# Patient Record
Sex: Female | Born: 1990 | Race: Black or African American | Hispanic: No | Marital: Single | Smoking: Never smoker
Health system: Southern US, Community
[De-identification: ages and names within clinical notes are randomized; demographics above are authoritative.]

## PROBLEM LIST (undated history)

## (undated) DIAGNOSIS — K219 Gastro-esophageal reflux disease without esophagitis: Secondary | ICD-10-CM

## (undated) DIAGNOSIS — T7840XA Allergy, unspecified, initial encounter: Secondary | ICD-10-CM

## (undated) HISTORY — DX: Allergy, unspecified, initial encounter: T78.40XA

---

## 2010-08-24 ENCOUNTER — Ambulatory Visit: Payer: Self-pay | Admitting: Obstetrics and Gynecology

## 2010-08-31 ENCOUNTER — Ambulatory Visit: Payer: Self-pay | Admitting: Obstetrics & Gynecology

## 2012-04-17 ENCOUNTER — Emergency Department (HOSPITAL_BASED_OUTPATIENT_CLINIC_OR_DEPARTMENT_OTHER): Payer: Managed Care, Other (non HMO)

## 2012-04-17 ENCOUNTER — Encounter (HOSPITAL_BASED_OUTPATIENT_CLINIC_OR_DEPARTMENT_OTHER): Payer: Self-pay | Admitting: *Deleted

## 2012-04-17 ENCOUNTER — Emergency Department (HOSPITAL_BASED_OUTPATIENT_CLINIC_OR_DEPARTMENT_OTHER)
Admission: EM | Admit: 2012-04-17 | Discharge: 2012-04-17 | Disposition: A | Discharge status: Home / Self Care | Payer: Managed Care, Other (non HMO) | Attending: Emergency Medicine | Admitting: Emergency Medicine

## 2012-04-17 DIAGNOSIS — R0789 Other chest pain: Secondary | ICD-10-CM

## 2012-04-17 DIAGNOSIS — K219 Gastro-esophageal reflux disease without esophagitis: Secondary | ICD-10-CM | POA: Insufficient documentation

## 2012-04-17 DIAGNOSIS — R002 Palpitations: Secondary | ICD-10-CM

## 2012-04-17 DIAGNOSIS — R071 Chest pain on breathing: Secondary | ICD-10-CM | POA: Insufficient documentation

## 2012-04-17 HISTORY — DX: Gastro-esophageal reflux disease without esophagitis: K21.9

## 2012-04-17 LAB — BASIC METABOLIC PANEL
BUN: 9 mg/dL (ref 6–23)
Chloride: 103 mEq/L (ref 96–112)
GFR calc Af Amer: >90 mL/min (ref >90)
Glucose, Bld: 102 mg/dL — ABNORMAL HIGH (ref 70–99)
Potassium: 3.4 mEq/L — ABNORMAL LOW (ref 3.5–5.1)
Sodium: 137 mEq/L (ref 135–145)

## 2012-04-17 LAB — CBC
HCT: 30.6 % — ABNORMAL LOW (ref 36.0–46.0)
Hemoglobin: 10.8 g/dL — ABNORMAL LOW (ref 12.0–15.0)
MCHC: 35.3 g/dL (ref 30.0–36.0)
RBC: 3.75 MIL/uL — ABNORMAL LOW (ref 3.87–5.11)

## 2012-04-17 MED ORDER — POTASSIUM CHLORIDE CRYS ER 20 MEQ PO TBCR
40.0000 meq | EXTENDED_RELEASE_TABLET | Freq: Once | ORAL | Status: AC
  Administered 2012-04-17: 40 meq via ORAL
  Filled 2012-04-17: qty 2

## 2012-04-17 MED ORDER — POTASSIUM CHLORIDE CRYS ER 20 MEQ PO TBCR
EXTENDED_RELEASE_TABLET | ORAL | Status: AC
  Filled 2012-04-17: qty 1

## 2012-04-17 NOTE — ED Notes (Signed)
Chest tightness and irregular beat last night. Today she is having constant tightness in her chest.

## 2012-04-17 NOTE — ED Provider Notes (Signed)
History  This chart was scribed for Ethelda Chick, MD by Erskine Emery. This patient was seen in room MH06/MH06 and the patient's care was started at 16:53.   CSN: [id number]  Arrival date & time 04/17/12  1503   First MD Initiated Contact with Patient 04/17/12 1653      Chief Complaint  Patient presents with  . Chest Pain    (Consider location/radiation/quality/duration/timing/severity/associated sxs/prior treatment) HPI Victoria Mercado is a 21 y.o. female who presents to the Emergency Department complaining of constant chest tightness with associated irregular heart beat (intermittent skipping of a beat) and abnormally fast heart beat since last night. Pt denies any associated SOB, fever, cough, extremity swelling, recent travel or surgeries, or other medical issues. Pt reports currently her heart feels back to normal but the chest tightness (in upper center chest) has persisted, with nothing noted that aggravates or relieves it. Pt reports she has noticed her heart skipping before but has never seen a doctor for it. Pt has an IUD for birth control. There are no other associated systemic symptoms, there are no other alleviating or modifying factors.    EKG:    Past Medical History  Diagnosis Date  . GERD (gastroesophageal reflux disease)     History reviewed. No pertinent past surgical history.  No family history on file.  History  Substance Use Topics  . Smoking status: Never Smoker   . Smokeless tobacco: Not on file  . Alcohol Use: Yes    OB History    Grav Para Term Preterm Abortions TAB SAB Ect Mult Living                  Review of Systems A complete 10 system review of systems was obtained and all systems are negative except as noted in the HPI and PMH.    Allergies  Review of patient's allergies indicates no known allergies.  Home Medications   Current Outpatient Rx  Name Route Sig Dispense Refill  . ACETAMINOPHEN-CAFFEINE 500-65 MG PO TABS Oral Take 2  tablets by mouth once as needed. For headache    . LEVONORGESTREL 13.5 MG IU IUD Intrauterine 1 Device by Intrauterine route once. Inserted February 2013    . PRENATAL 27-0.8 MG PO TABS Oral Take 1 tablet by mouth daily.    Marland Kitchen RANITIDINE HCL 150 MG PO TABS Oral Take 150 mg by mouth daily.      Triage Vitals: BP 127/71  Pulse 88  Temp 98 F (36.7 C) (Oral)  Resp 20  SpO2 100%  Physical Exam  Nursing note and vitals reviewed. Constitutional: She is oriented to person, place, and time. She appears well-developed and well-nourished. No distress.  HENT:  Head: Normocephalic and atraumatic.  Eyes: EOM are normal. Pupils are equal, round, and reactive to light.  Neck: Neck supple. No tracheal deviation present.  Cardiovascular: Normal rate.   Pulmonary/Chest: Effort normal and breath sounds normal. No respiratory distress.       Lungs are clear. Tenderness to left costosternal junction.   Abdominal: Soft. She exhibits no distension.  Musculoskeletal: Normal range of motion. She exhibits no edema.  Neurological: She is alert and oriented to person, place, and time.  Skin: Skin is warm and dry.  Psychiatric: She has a normal mood and affect.    ED Course  Procedures (including critical care time) DIAGNOSTIC STUDIES: Oxygen Saturation is 100% on room air, normal by my interpretation.    COORDINATION OF CARE: 16:58--I evaluated the patient  and we discussed a treatment plan including a chest x-ray and check of electrolytes to which the pt agreed. I recommended that the pt use Ibuprofen for the chest pain. I instructed the pt to follow up with her PCP and potentially a cardiologist if the issues continue.    Date: 04/17/2012  Rate: 88  Rhythm: normal sinus rhythm  QRS Axis: normal  Intervals: normal  ST/T Wave abnormalities: normal  Conduction Disutrbances: none  Narrative Interpretation: unremarkable       Labs Reviewed  BASIC METABOLIC PANEL - Abnormal; Notable for the  following:    Potassium 3.4 (*)     Glucose, Bld 102 (*)     All other components within normal limits  CBC - Abnormal; Notable for the following:    RBC 3.75 (*)     Hemoglobin 10.8 (*)     HCT 30.6 (*)     All other components within normal limits   Dg Chest 2 View  04/17/2012  *RADIOLOGY REPORT*  Clinical Data: Chest tightness and pain, irregular heartbeat  CHEST - 2 VIEW  Comparison: None.  Findings: The lungs are clear.  Mediastinal contours appear normal. The heart is within upper limits of normal.  No bony abnormality is seen.  IMPRESSION: No active lung disease.  Original Report Authenticated By: Juline Patch, M.D.     1. Chest wall pain   2. Palpitations       MDM  Pt presenting with reproducible chest pain.  Given her age, no risk factors and reproducibility along with normal EKG and CXR, I have a low suspicion for ACS, PE (PERC 0) or other emergent condition at this time.  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  Recommended ibuprofen for patient 's symptoms.  Discharged with strict return precautions.  Pt agreeable with plan.    Discharged with strict return precautions.  Pt agreeable with plan.   Ethelda Chick, MD 04/18/12 [id number]

## 2012-05-17 ENCOUNTER — Ambulatory Visit (INDEPENDENT_AMBULATORY_CARE_PROVIDER_SITE_OTHER): Payer: Managed Care, Other (non HMO) | Admitting: Physician Assistant

## 2012-05-17 ENCOUNTER — Encounter: Payer: Self-pay | Admitting: Physician Assistant

## 2012-05-17 VITALS — BP 96/60 | HR 87 | Temp 98.2°F | Resp 16 | Ht 63.5 in | Wt 212.6 lb

## 2012-05-17 DIAGNOSIS — E669 Obesity, unspecified: Secondary | ICD-10-CM

## 2012-05-17 DIAGNOSIS — Z0000 Encounter for general adult medical examination without abnormal findings: Secondary | ICD-10-CM

## 2012-05-17 LAB — COMPREHENSIVE METABOLIC PANEL
ALT: 13 U/L (ref 0–35)
BUN: 9 mg/dL (ref 6–23)
CO2: 25 mEq/L (ref 19–32)
Calcium: 9.4 mg/dL (ref 8.4–10.5)
Chloride: 104 mEq/L (ref 96–112)
Creat: 0.71 mg/dL (ref 0.50–1.10)
Glucose, Bld: 96 mg/dL (ref 70–99)
Total Bilirubin: 0.3 mg/dL (ref 0.3–1.2)

## 2012-05-17 LAB — CBC
HCT: 33.2 % — ABNORMAL LOW (ref 36.0–46.0)
MCH: 28.6 pg (ref 26.0–34.0)
MCV: 81.2 fL (ref 78.0–100.0)
Platelets: 285 10*3/uL (ref 150–400)
RBC: 4.09 MIL/uL (ref 3.87–5.11)
WBC: 5.9 10*3/uL (ref 4.0–10.5)

## 2012-05-17 LAB — POCT URINALYSIS DIPSTICK
Bilirubin, UA: NEGATIVE
Blood, UA: NEGATIVE
Ketones, UA: NEGATIVE
Leukocytes, UA: NEGATIVE
Protein, UA: NEGATIVE
Spec Grav, UA: 1.02
pH, UA: 6

## 2012-05-17 LAB — LIPID PANEL
Cholesterol: 169 mg/dL (ref 0–200)
HDL: 40 mg/dL (ref >39)
Total CHOL/HDL Ratio: 4.2 Ratio
Triglycerides: 116 mg/dL (ref <150)
VLDL: 23 mg/dL (ref 0–40)

## 2012-05-17 NOTE — Progress Notes (Signed)
  Subjective:    Patient ID: Victoria Mercado, female    DOB: Feb 10, 1991, 21 y.o.   MRN: [id number]  HPI  Pt presents to clinic for CPE without Pap.  She is doing well.  Had a normal pap in 09/2011 at [facility].  Has skyla IUD and really likes it, still has a normal cycle.  Would like to discuss weight loss and dietary changes.  She eats a granola bar for breakfast, fruit for snacks and a packed lunch of rice and meat or pasta and meat.  She rarely eats a planned dinner and many nights finds herself snacking on crackers etc for dinner.  She gets no exercise.  Would like a breast exam because she feels bumps in her breast all the time and wants to see if they are normal.  Review of Systems  Constitutional: Negative for fever, chills, activity change, appetite change and fatigue.       Objective:   Physical Exam  Vitals reviewed. Constitutional: She is oriented to person, place, and time. She appears well-developed and well-nourished.  HENT:  Head: Normocephalic and atraumatic.  Right Ear: External ear normal.  Left Ear: External ear normal.  Nose: Nose normal.  Eyes: Conjunctivae and EOM are normal.  Neck: Neck supple.  Cardiovascular: Normal rate, regular rhythm and normal heart sounds.  Exam reveals no gallop and no friction rub.   No murmur heard. Pulmonary/Chest: Effort normal and breath sounds normal.  Genitourinary: No breast swelling, tenderness, discharge or bleeding.       Breast - fibrous breasts bilateral, left breast has harder area in the upper medial quadrant - 2cm, mobile, no TTP  Musculoskeletal: Normal range of motion.  Neurological: She is alert and oriented to person, place, and time. She has normal reflexes.  Skin: Skin is warm and dry.  Psychiatric: She has a normal mood and affect. Her behavior is normal. Judgment and thought content normal.       Assessment & Plan:   1. Annual physical exam  CBC, Comprehensive metabolic panel, Lipid panel, TSH, POCT urinalysis dipstick   2. Obesity  Lipid panel, TSH   1- PT to monitor the area in L breast - believe fibrous so patient to monitor and if any changes call for ultrasound 2- d/w pt at length dietary changes and exercise additions to help with weight loss.

## 2012-07-18 ENCOUNTER — Ambulatory Visit (INDEPENDENT_AMBULATORY_CARE_PROVIDER_SITE_OTHER): Payer: Managed Care, Other (non HMO) | Admitting: Family Medicine

## 2012-07-18 VITALS — BP 110/62 | HR 88 | Temp 98.4°F | Resp 16 | Ht 64.5 in | Wt 215.0 lb

## 2012-07-18 DIAGNOSIS — R51 Headache: Secondary | ICD-10-CM

## 2012-07-18 DIAGNOSIS — R519 Headache, unspecified: Secondary | ICD-10-CM

## 2012-07-18 MED ORDER — METAXALONE 800 MG PO TABS: ORAL_TABLET | ORAL | Status: DC

## 2012-07-18 NOTE — Progress Notes (Signed)
S:  This 21 y.o. AA female presents with HA since Oct 10th, located at back of head. No associated symptoms such as neck pain, nausea, visual disturbances, dizziness, weakness or syncope. No hx of head or neck trauma. Pt awakens with HA;  sleep hygiene is fair. Seasonal allergies and sinus congestion are common; no routine medication taken for this. She gets temporary relief with OTC Excedrin product. 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  ROS: As per HPI.  O:  Filed Vitals:   07/18/12 1607  BP: 110/62  Pulse: 88  Temp: 98.4 F (36.9 C)  Resp: 16   GEN: In NAD; WN,WD. HEENT: [place]/AT. EOMI w/ clear conj/ scl. EACs/TMs normal. Oroph is normal. NECK: Supple w/o LAN or TMG. COR: RRR; No edema. LUNGS: CTA; no wheezes. Normal effort. BACK: No CVAT. NEURO: A&O x 3; CNs intact. DTRs 1+/=, difficult to illicit. Motor/ sensory: normal. Gait is normal.  A/P:  1. Headache in back of head  Vitamin D, 25-hydroxy, Sedimentation Rate RX: Skelaxin 800 mg 1/2 tablet tid prn but advised bedtime dosing because it may cause dizziness. May continue OTC medication.  May be related to lack of adequate sleep. Encourage better sleep hygiene, good head support with good  pillow. Improve nutrition and activity level.

## 2012-07-18 NOTE — Patient Instructions (Addendum)
General Headache Without Cause A headache is pain or discomfort felt around the head or neck area. The specific cause of a headache may not be found. There are many causes and types of headaches. A few common ones are:  Tension headaches.  Migraine headaches.  Cluster headaches.  Chronic daily headaches. HOME CARE INSTRUCTIONS   Keep all follow-up appointments with your caregiver or any specialist referral.  Only take over-the-counter or prescription medicines for pain or discomfort as directed by your caregiver.  Lie down in a dark, quiet room when you have a headache.  Keep a headache journal to find out what may trigger your migraine headaches. For example, write down:  What you eat and drink.  How much sleep you get.  Any change to your diet or medicines.  Try massage or other relaxation techniques.  Put ice packs or heat on the head and neck. Use these 3 to 4 times per day for 15 to 20 minutes each time, or as needed.  Limit stress.  Sit up straight, and do not tense your muscles.  Quit smoking if you smoke.  Limit alcohol use.  Decrease the amount of caffeine you drink, or stop drinking caffeine.  Eat and sleep on a regular schedule.  Get 7 to 9 hours of sleep, or as recommended by your caregiver.  Keep lights dim if bright lights bother you and make your headaches worse. SEEK MEDICAL CARE IF:   You have problems with the medicines you were prescribed.  Your medicines are not working.  You have a change from the usual headache.  You have nausea or vomiting. SEEK IMMEDIATE MEDICAL CARE IF:   Your headache becomes severe.  You have a fever.  You have a stiff neck.  You have loss of vision.  You have muscular weakness or loss of muscle control.  You start losing your balance or have trouble walking.  You feel faint or pass out.  You have severe symptoms that are different from your first symptoms. MAKE SURE YOU:   Understand these  instructions.  Will watch your condition.  Will get help right away if you are not doing well or get worse. Document Released: 08/30/2005 Document Revised: 11/22/2011 Document Reviewed: 09/15/2011 [facility] Patient Information 2013 [place], [place].   Dehydration, Adult Dehydration is when you lose more fluids from the body than you take in. Vital organs like the kidneys, brain, and heart cannot function without a proper amount of fluids and salt. Any loss of fluids from the body can cause dehydration.  CAUSES   Vomiting.  Diarrhea.  Excessive sweating.  Excessive urine output.  Fever. SYMPTOMS  Mild dehydration  Thirst.  Dry lips.  Slightly dry mouth. Moderate dehydration  Very dry mouth.  Sunken eyes.  Skin does not bounce back quickly when lightly pinched and released.  Dark urine and decreased urine production.  Decreased tear production.  Headache. Severe dehydration  Very dry mouth.  Extreme thirst.  Rapid, weak pulse (more than 100 beats per minute at rest).  Cold hands and feet.  Not able to sweat in spite of heat and temperature.  Rapid breathing.  Blue lips.  Confusion and lethargy.  Difficulty being awakened.  Minimal urine production.  No tears. DIAGNOSIS  Your caregiver will diagnose dehydration based on your symptoms and your exam. Blood and urine tests will help confirm the diagnosis. The diagnostic evaluation should also identify the cause of dehydration. TREATMENT  Treatment of mild or moderate dehydration can often  be done at home by increasing the amount of fluids that you drink. It is best to drink small amounts of fluid more often. Drinking too much at one time can make vomiting worse. Refer to the home care instructions below. Severe dehydration needs to be treated at the hospital where you will probably be given intravenous (IV) fluids that contain water and electrolytes. HOME CARE INSTRUCTIONS   Ask your caregiver about  specific rehydration instructions.  Drink enough fluids to keep your urine clear or pale yellow.  Drink small amounts frequently if you have nausea and vomiting.  Eat as you normally do.  Avoid:  Foods or drinks high in sugar.  Carbonated drinks.  Juice.  Extremely hot or cold fluids.  Drinks with caffeine.  Fatty, greasy foods.  Alcohol.  Tobacco.  Overeating.  Gelatin desserts.  Wash your hands well to avoid spreading bacteria and viruses.  Only take over-the-counter or prescription medicines for pain, discomfort, or fever as directed by your caregiver.  Ask your caregiver if you should continue all prescribed and over-the-counter medicines.  Keep all follow-up appointments with your caregiver. SEEK MEDICAL CARE IF:  You have abdominal pain and it increases or stays in one area (localizes).  You have a rash, stiff neck, or severe headache.  You are irritable, sleepy, or difficult to awaken.  You are weak, dizzy, or extremely thirsty. SEEK IMMEDIATE MEDICAL CARE IF:   You are unable to keep fluids down or you get worse despite treatment.  You have frequent episodes of vomiting or diarrhea.  You have blood or green matter (bile) in your vomit.  You have blood in your stool or your stool looks black and tarry.  You have not urinated in 6 to 8 hours, or you have only urinated a small amount of very dark urine.  You have a fever.  You faint. MAKE SURE YOU:   Understand these instructions.  Will watch your condition.  Will get help right away if you are not doing well or get worse. Document Released: 08/30/2005 Document Revised: 11/22/2011 Document Reviewed: 04/19/2011 [facility] Patient Information 2013 [employer], [place].   It is important to maintain adequate hydration; try to drink 3- 4  20- ounce bottles of water daily. Other liquids are helpful as long as they do not act as a "diuretic"- substance that make you urinate more (like any beverage that  contains caffeine).

## 2012-07-19 ENCOUNTER — Encounter: Payer: Self-pay | Admitting: Family Medicine

## 2012-07-20 NOTE — Progress Notes (Signed)
Quick Note:  Please call pt and advise that the following labs are abnormal... Vitamin D level is below normal; get the over-the -counter Vitamin D3 2000 IU and take 1 capsule daily and try to get some sun exposure most days of the week.  The Sedimentation Rate was 10 points above normal which is not that significant. This value can be seen with mild to moderate inflammation in the body.  Continue the medications as we discussed. Try to make sure your nutrition is healthy and you are getting at least 7-8 hours of sleep most nights of the week.  Copy to pt. ______

## 2012-10-30 ENCOUNTER — Telehealth: Payer: Self-pay

## 2012-10-30 NOTE — Telephone Encounter (Signed)
Weber: patient calling, she had an inhaler prescription (doesn't remember what the name is) a year ago and the pharmacy says they have lost that prescription and she wants to know if you call in another prescription. She uses [facility] on [address].  Her best number is [phone]

## 2012-10-30 NOTE — Telephone Encounter (Signed)
I need to know the name of the inhaler, do not see this. She states may have been a couple years ago. She is advised to return to clinic, esp since she is needing it for shortness of breath.

## 2013-03-05 ENCOUNTER — Telehealth: Payer: Self-pay | Admitting: Physician Assistant

## 2013-03-05 NOTE — Telephone Encounter (Signed)
Pt dropped off immunization form for school several weeks ago.  Needs booster of Menveo and Tdap.  Placed futures order so pt can receive these and have form completed.

## 2013-10-21 IMAGING — CR DG CHEST 2V
2 series · 2 of 2 positions shown · non-contrast
Comparison: None.

CLINICAL DATA: Chest tightness and pain, irregular heartbeat

CHEST - 2 VIEW

[w chest pa]
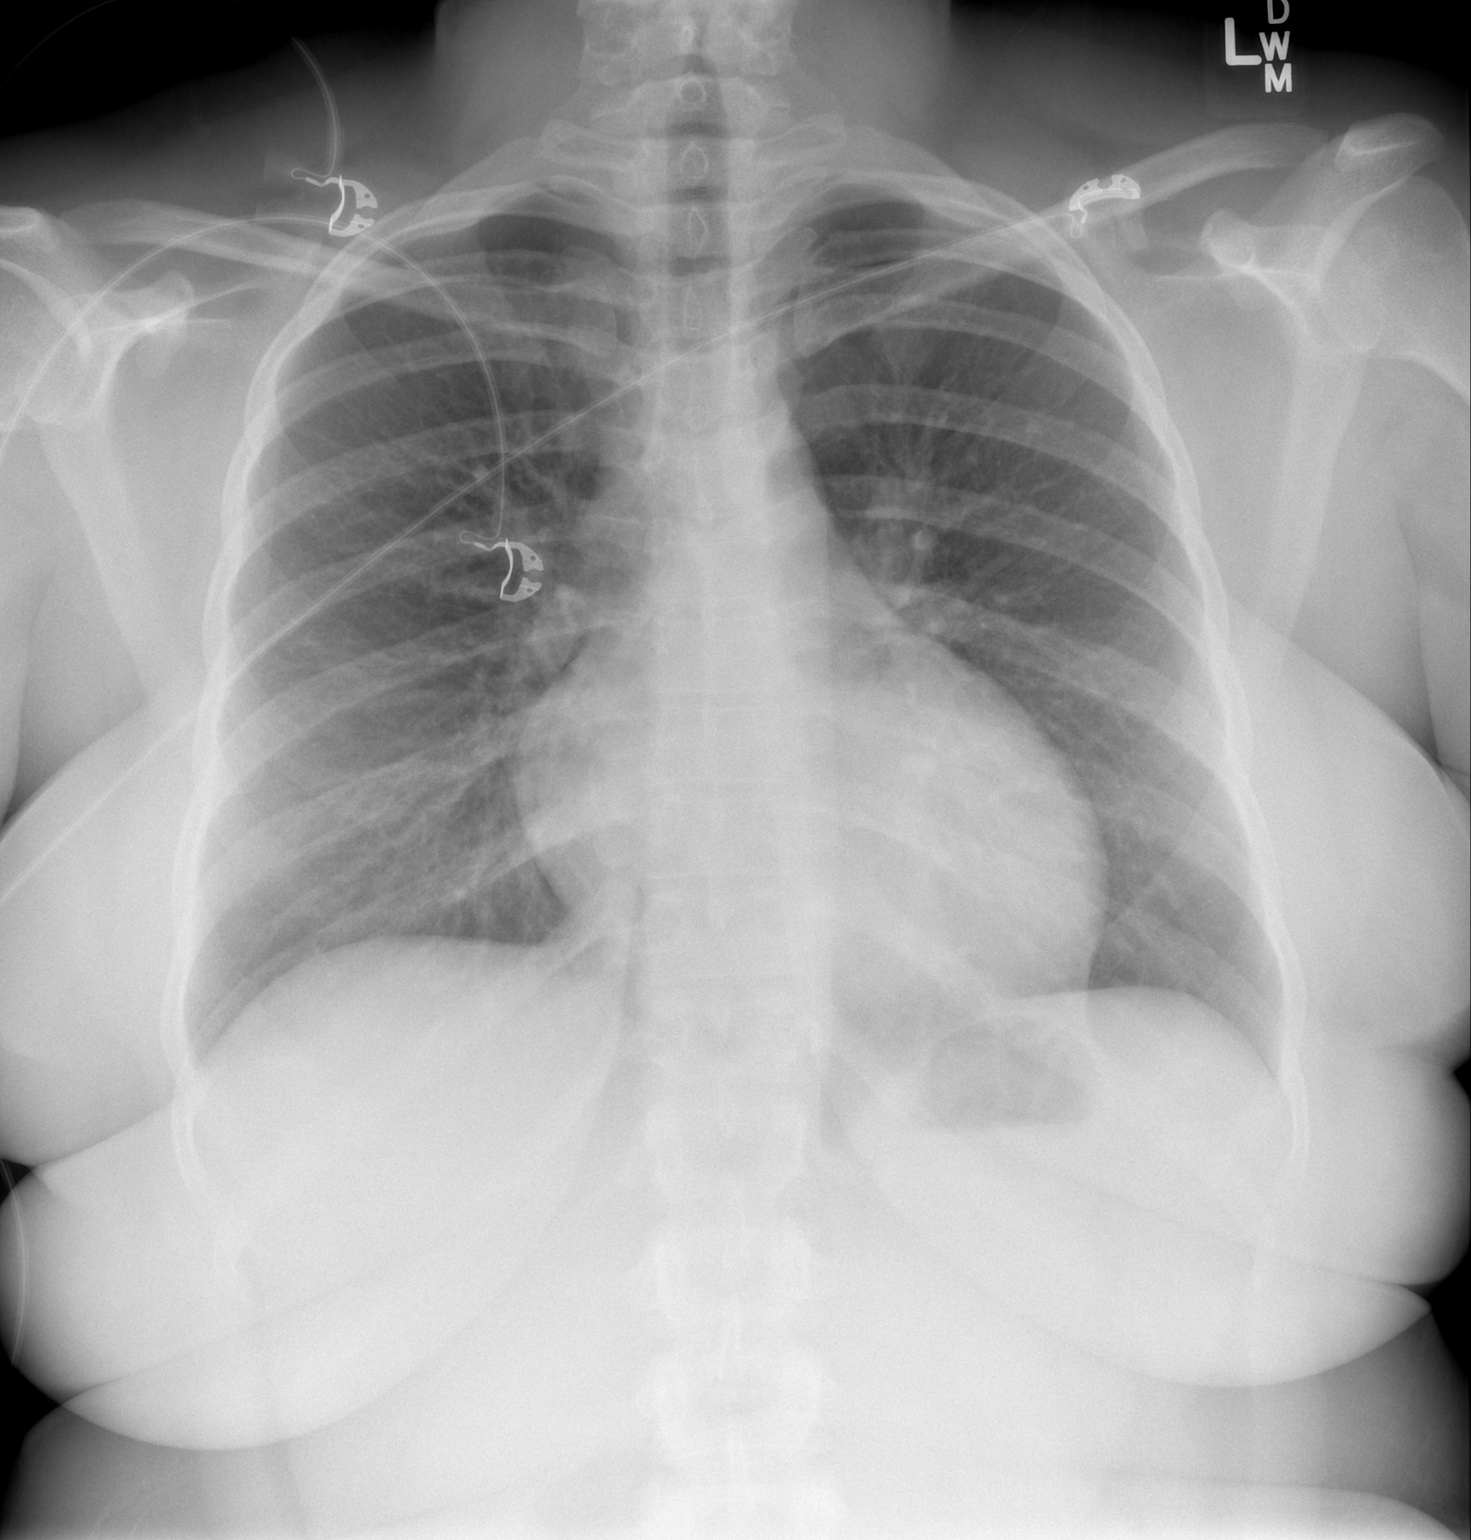

[w chest lat]
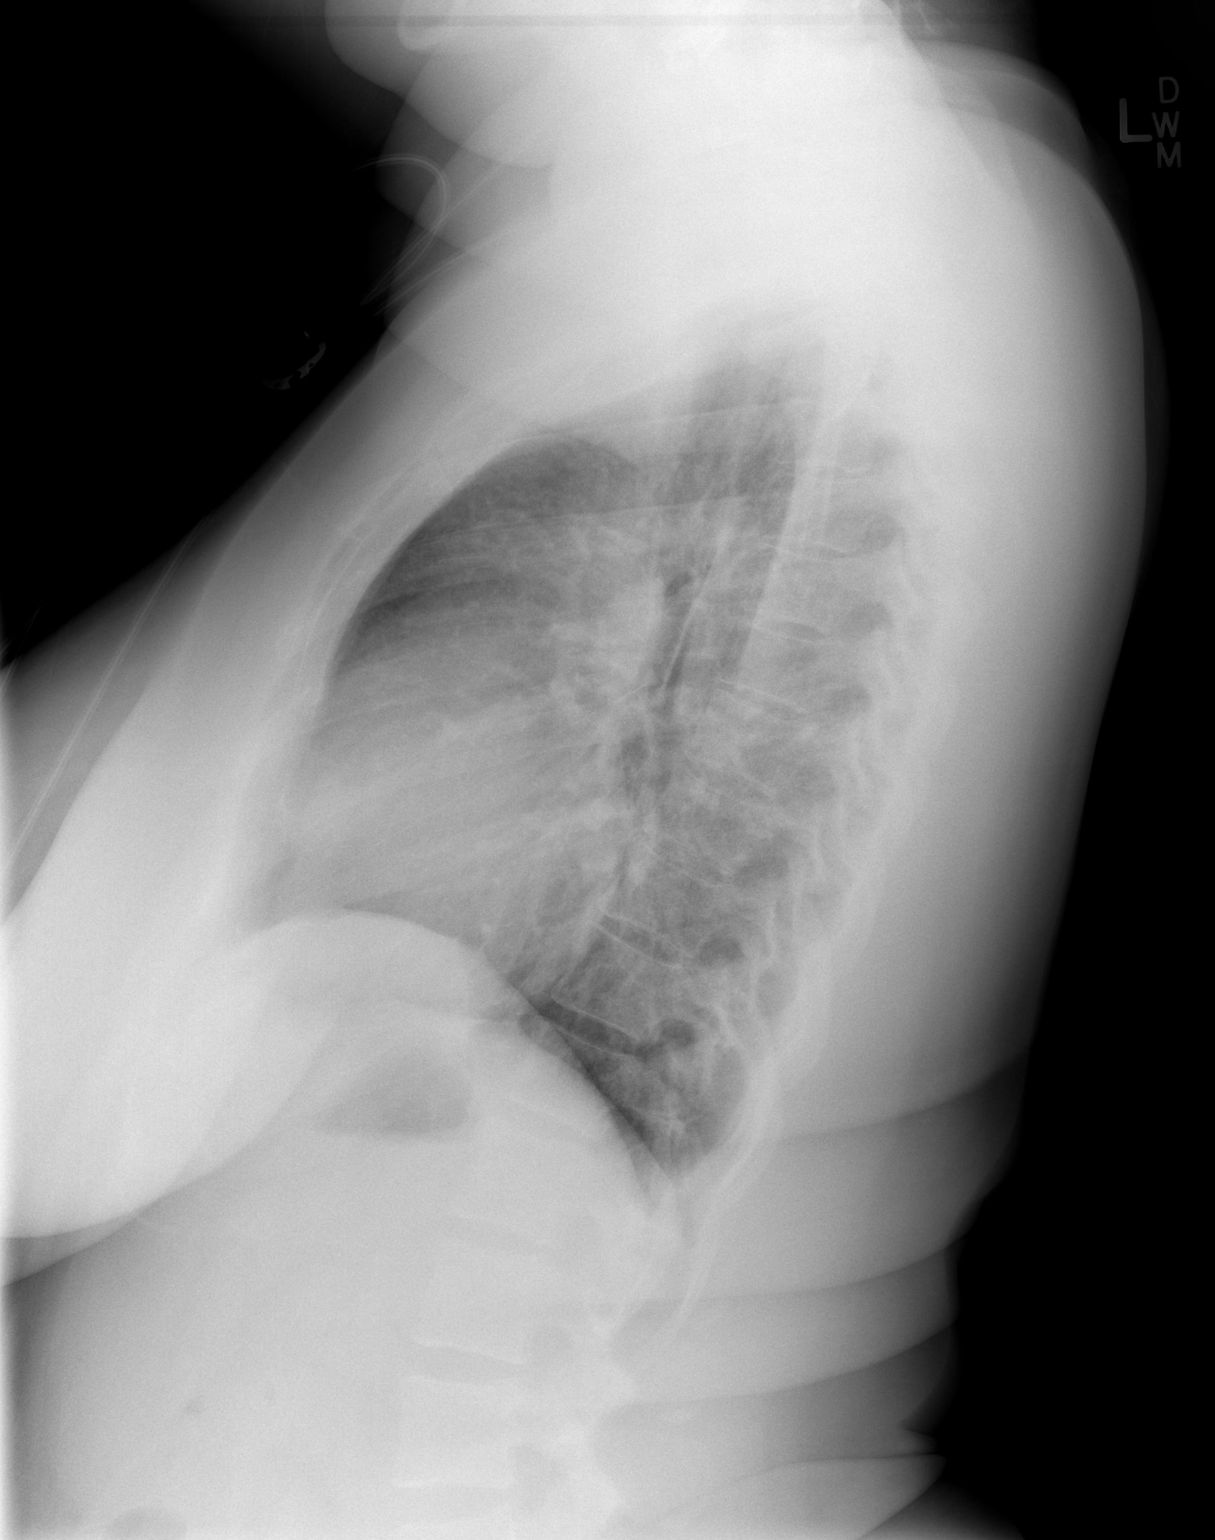

[2 of 2 positions shown; findings below may reference images not displayed]

FINDINGS: The lungs are clear.  Mediastinal contours appear normal.
The heart is within upper limits of normal.  No bony abnormality is
seen.
IMPRESSION: No active lung disease.

## 2015-10-13 ENCOUNTER — Encounter: Payer: Self-pay | Admitting: Physician Assistant

## 2015-10-13 ENCOUNTER — Ambulatory Visit (INDEPENDENT_AMBULATORY_CARE_PROVIDER_SITE_OTHER): Payer: BLUE CROSS/BLUE SHIELD | Admitting: Physician Assistant

## 2015-10-13 VITALS — BP 113/73 | HR 93 | Temp 98.2°F | Resp 16 | Ht 63.0 in | Wt 205.8 lb

## 2015-10-13 DIAGNOSIS — Z0000 Encounter for general adult medical examination without abnormal findings: Secondary | ICD-10-CM | POA: Diagnosis not present

## 2015-10-13 DIAGNOSIS — R519 Headache, unspecified: Secondary | ICD-10-CM

## 2015-10-13 DIAGNOSIS — Z130 Encounter for screening for diseases of the blood and blood-forming organs and certain disorders involving the immune mechanism: Secondary | ICD-10-CM | POA: Diagnosis not present

## 2015-10-13 DIAGNOSIS — Z13228 Encounter for screening for other metabolic disorders: Secondary | ICD-10-CM | POA: Diagnosis not present

## 2015-10-13 DIAGNOSIS — Z113 Encounter for screening for infections with a predominantly sexual mode of transmission: Secondary | ICD-10-CM | POA: Diagnosis not present

## 2015-10-13 DIAGNOSIS — R51 Headache: Secondary | ICD-10-CM

## 2015-10-13 DIAGNOSIS — Z13220 Encounter for screening for lipoid disorders: Secondary | ICD-10-CM

## 2015-10-13 MED ORDER — IBUPROFEN 800 MG PO TABS: 800.0000 mg | ORAL_TABLET | Freq: Three times a day (TID) | ORAL | Status: DC | PRN

## 2015-10-13 NOTE — Patient Instructions (Signed)

## 2015-10-13 NOTE — Progress Notes (Signed)
[facility] [address] [place] [address] [phone]  Date:  10/13/2015   Name:  Victoria Mercado   DOB:  08-13-1991   MRN:  [id number]  PCP:  Virgilio Belling    History of Present Illness:  Victoria Mercado is a 25 y.o. female patient who presents to [facility] for annual physical exam and cc of headache   HA: headaches behind her eyes which has been chronic.  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  She will have a headache going outdoors or with staring at a computer headache.  It will last all day after onset.  She will take ibuprofen which helps but the headache will last throughout the day.  This will be for  once per day.  No vision changes.  She will have a pain behind the left eye as a precursor at the left eye.  No nausea associated.  There is photophobia, however no phonophobia.  No nasal congestion or ear pain.  She has annual eye exam.  2 years she ran.  Patient has tried excedrin in the past which works.  Also attempted skelaxin which did not help.    Diet: no fried foods.  She eats fruits and vegetables.  No sodas.  No junk food.  Water intake is about 2 water bottles.    BM: Normal.  No blood in the stool.  No constipation or diarrhea.  Urination: normal.  No hematuria, dysuria, or frequency.  Sleep: wakes in the middle of the night but can get 6-7 hours per day.  Social activity: PhDcommunity heatlth and education.  She enjoys reading, hanging with friends, or watching movies.  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 [employer].  EtOH: 1-2 glasses per month Illicit drug use: None Tobacco use: none Sexually active protected.   Patient Active Problem List   Diagnosis Date Noted  . Obesity 05/17/2012    Past Medical History  Diagnosis Date  . GERD (gastroesophageal reflux disease)   . Allergy     No past surgical history on file.  Social History  Substance Use Topics  . Smoking status: Never Smoker   . Smokeless tobacco: None  . Alcohol Use: Yes     Family History  Problem Relation Age of Onset  . Cancer Maternal Grandmother 36    ovarian cancer  . Hypertension Maternal Grandfather   . Stroke Maternal Grandfather   . Hypertension Paternal Grandmother     No Known Allergies  Medication list has been reviewed and updated.  Current Outpatient Prescriptions on File Prior to Visit  Medication Sig Dispense Refill  . Acetaminophen-Caffeine (EXCEDRIN TENSION HEADACHE) 500-65 MG TABS Take 2 tablets by mouth once as needed. Reported on 10/13/2015    . Levonorgestrel (SKYLA) 13.5 MG IUD 1 Device by Intrauterine route once. Reported on 10/13/2015    . metaxalone (SKELAXIN) 800 MG tablet Take 1/2 tablet  2-3 times daily for headache. 40 tablet 1  . Prenatal Vit-Fe Fumarate-FA (MULTIVITAMIN-PRENATAL) 27-0.8 MG TABS Take 1 tablet by mouth daily. Reported on 10/13/2015    . ranitidine (ZANTAC) 150 MG tablet Take 150 mg by mouth daily. Reported on 10/13/2015     No current facility-administered medications on file prior to visit.    ROS ROS otherwise unremarkable unless listed above.   Physical Examination: BP 113/73 mmHg  Pulse 93  Temp(Src) 98.2 F (36.8 C) (Oral)  Resp 16  Ht  (1.6 m)  Wt 205 lb 12.8 oz (93.35 kg)  BMI 36.46 kg/m2  SpO2 99%  LMP 09/23/2015 Ideal Body Weight: Weight in (lb) to have BMI = 25: 140.8  Physical Exam  Constitutional: She is oriented to person, place, and time. She appears well-developed and well-nourished. No distress.  HENT:  Head: Normocephalic and atraumatic.  Right Ear: Tympanic membrane, external ear and ear canal normal.  Left Ear: Tympanic membrane, external ear and ear canal normal.  Nose: Right sinus exhibits no maxillary sinus tenderness and no frontal sinus tenderness. Left sinus exhibits no maxillary sinus tenderness and no frontal sinus tenderness.  Mouth/Throat: Oropharynx is clear and moist. No uvula swelling. No oropharyngeal exudate, posterior oropharyngeal edema or posterior  oropharyngeal erythema.  Eyes: Conjunctivae and EOM are normal. Pupils are equal, round, and reactive to light.  No abnormalities detected on fundoscopic exam.  Neck: Normal range of motion. Neck supple. No thyromegaly present.  Cardiovascular: Normal rate, regular rhythm, normal heart sounds and intact distal pulses.  Exam reveals no gallop, no distant heart sounds and no friction rub.   No murmur heard. Pulses:      Radial pulses are 2+ on the right side, and 2+ on the left side.       Dorsalis pedis pulses are 2+ on the right side, and 2+ on the left side.  Pulmonary/Chest: Effort normal and breath sounds normal. No respiratory distress. She has no decreased breath sounds. She has no wheezes. She has no rhonchi.  Abdominal: Soft. Bowel sounds are normal. She exhibits no distension and no mass. There is no tenderness.  Musculoskeletal: Normal range of motion. She exhibits no edema or tenderness.  Lymphadenopathy:       Head (right side): No submandibular, no tonsillar, no preauricular and no posterior auricular adenopathy present.       Head (left side): No submandibular, no tonsillar, no preauricular and no posterior auricular adenopathy present.    She has no cervical adenopathy.  Neurological: She is alert and oriented to person, place, and time. No cranial nerve deficit. She exhibits normal muscle tone. Coordination normal.  Skin: Skin is warm and dry. She is not diaphoretic.  Psychiatric: She has a normal mood and affect. Her behavior is normal.     Assessment and Plan: Victoria Mercado is a 25 y.o. female who is here today for annual physical exam, and to discuss concern of chronic headaches.   I will treat with ibuprofen.  At this time, I am advising a second opinion from opthalmologist.  General labs performed today.  Annual physical exam - Plan: CBC, Lipid panel, COMPLETE METABOLIC PANEL WITH GFR, HIV antibody, RPR, CANCELED: POCT urinalysis dipstick, CANCELED: POCT Microscopic  Urinalysis ([facility])  Screening for metabolic disorder - Plan: COMPLETE METABOLIC PANEL WITH GFR, CANCELED: POCT urinalysis dipstick, CANCELED: POCT Microscopic Urinalysis ([facility])  Screening for STD (sexually transmitted disease) - Plan: HIV antibody, RPR  Screening for deficiency anemia - Plan: CBC  Screening for lipid disorders - Plan: Lipid panel  Frontal headache - Plan: ibuprofen (ADVIL,MOTRIN) 800 MG tablet, Ambulatory referral to Ophthalmology  Trena Platt, PA-C [facility] 1/30/20177:25 PM

## 2015-10-15 ENCOUNTER — Telehealth: Payer: Self-pay | Admitting: *Deleted

## 2015-10-15 LAB — CBC

## 2015-10-15 LAB — COMPLETE METABOLIC PANEL WITH GFR
ALBUMIN: 4.2 g/dL (ref 3.6–5.1)
ALK PHOS: 43 U/L (ref 33–115)
ALT: 11 U/L (ref 6–29)
AST: 13 U/L (ref 10–30)
BUN: 9 mg/dL (ref 7–25)
CALCIUM: 9 mg/dL (ref 8.6–10.2)
CHLORIDE: 102 mmol/L (ref 98–110)
CO2: 27 mmol/L (ref 20–31)
CREATININE: 0.75 mg/dL (ref 0.50–1.10)
GFR, Est Non African American: >89 mL/min (ref >=60)
GLUCOSE: 81 mg/dL (ref 65–99)
Potassium: 4 mmol/L (ref 3.5–5.3)
Sodium: 138 mmol/L (ref 135–146)
TOTAL PROTEIN: 6.7 g/dL (ref 6.1–8.1)
Total Bilirubin: 0.5 mg/dL (ref 0.2–1.2)

## 2015-10-15 LAB — LIPID PANEL
CHOLESTEROL: 194 mg/dL (ref 125–200)
HDL: 52 mg/dL (ref >=46)
LDL Cholesterol: 130 mg/dL — ABNORMAL HIGH (ref <130)
TRIGLYCERIDES: 58 mg/dL (ref <150)
Total CHOL/HDL Ratio: 3.7 Ratio (ref <=5.0)
VLDL: 12 mg/dL (ref <30)

## 2015-10-15 LAB — HIV ANTIBODY (ROUTINE TESTING W REFLEX): HIV: NONREACTIVE

## 2015-10-15 LAB — RPR

## 2015-10-15 NOTE — Telephone Encounter (Signed)
That's strange.  They were all sent at the time.  We can see if she wants the test.  You can call and ask if she would like to come in.  We can do it in house if that makes it better.  Please advise that her test are negative for HIV, syphilis.  Lipid panel was unremarkable.  We can check for the anemia, or infection.  Please apologize to her.  She can come in at any time.  This is not fasting.

## 2015-10-15 NOTE — Telephone Encounter (Signed)
Lavender was not sent on pt.  Would you like for [place] to call pt back in for a redraw?

## 2015-10-22 NOTE — Telephone Encounter (Signed)
Left VM to call back 

## 2015-11-06 NOTE — Telephone Encounter (Signed)
I would like her to get a cbc in house if she wants.  This is the only lab that was missing, since the lavender was not sent.  Hx of anemia so she should get this done.

## 2015-11-06 NOTE — Telephone Encounter (Signed)
Victoria Mercado  I am going through old call messages and saw no has tried to follow up with this pt. Do you know what test it was you were trying to get done on this pt.

## 2015-11-10 ENCOUNTER — Other Ambulatory Visit: Payer: Self-pay | Admitting: Physician Assistant

## 2015-11-10 DIAGNOSIS — G43809 Other migraine, not intractable, without status migrainosus: Secondary | ICD-10-CM

## 2015-11-10 MED ORDER — SUMATRIPTAN-NAPROXEN SODIUM 85-500 MG PO TABS: 1.0000 | ORAL_TABLET | ORAL | Status: DC | PRN

## 2015-11-10 NOTE — Progress Notes (Signed)
Patient eye recommendation is anti-glare glasses.  This is possible migraine variant.  Will treat like a migraine at this time.   Advised medication use.  She will pick up today.  She states that she will return tomorrow for cbc future order, as this needs to be done.

## 2015-11-10 NOTE — Telephone Encounter (Signed)
Spoke with pt advised her to come in for lab draw. Pt agreed. Future CBC pending. She also wanted to know if you have seen the notes from her eye doctor? She states he thinks everything is fine. Did you want to attach a diagnosis to the leb: hx of anemai?

## 2016-04-26 ENCOUNTER — Other Ambulatory Visit: Payer: Self-pay | Admitting: Physician Assistant

## 2016-04-26 DIAGNOSIS — R519 Headache, unspecified: Secondary | ICD-10-CM

## 2016-04-26 DIAGNOSIS — R51 Headache: Principal | ICD-10-CM

## 2016-04-29 ENCOUNTER — Telehealth: Payer: Self-pay

## 2016-05-26 DIAGNOSIS — Z6837 Body mass index (BMI) 37.0-37.9, adult: Secondary | ICD-10-CM | POA: Diagnosis not present

## 2016-05-26 DIAGNOSIS — Z01419 Encounter for gynecological examination (general) (routine) without abnormal findings: Secondary | ICD-10-CM | POA: Diagnosis not present

## 2016-07-29 DIAGNOSIS — Z30430 Encounter for insertion of intrauterine contraceptive device: Secondary | ICD-10-CM | POA: Diagnosis not present

## 2016-07-29 DIAGNOSIS — N882 Stricture and stenosis of cervix uteri: Secondary | ICD-10-CM | POA: Diagnosis not present

## 2016-10-12 ENCOUNTER — Other Ambulatory Visit: Payer: Self-pay | Admitting: Physician Assistant

## 2016-10-12 DIAGNOSIS — R51 Headache: Principal | ICD-10-CM

## 2016-10-12 DIAGNOSIS — R519 Headache, unspecified: Secondary | ICD-10-CM

## 2016-10-12 NOTE — Telephone Encounter (Signed)
I denied request given that patient's last office visit was 09/2015. I counseled that 800mg  ibuprofen is a very high dose and has potential for adverse effects. Recommended she use APAP, Alleve or Advil otc until she can come back for an office visit.

## 2016-11-02 ENCOUNTER — Ambulatory Visit (INDEPENDENT_AMBULATORY_CARE_PROVIDER_SITE_OTHER): Payer: BLUE CROSS/BLUE SHIELD | Admitting: Physician Assistant

## 2016-11-02 ENCOUNTER — Encounter: Payer: Self-pay | Admitting: Physician Assistant

## 2016-11-02 VITALS — BP 119/80 | HR 91 | Temp 97.9°F | Ht 63.0 in | Wt 212.8 lb

## 2016-11-02 DIAGNOSIS — Z1329 Encounter for screening for other suspected endocrine disorder: Secondary | ICD-10-CM

## 2016-11-02 DIAGNOSIS — Z0000 Encounter for general adult medical examination without abnormal findings: Secondary | ICD-10-CM | POA: Diagnosis not present

## 2016-11-02 DIAGNOSIS — Z13228 Encounter for screening for other metabolic disorders: Secondary | ICD-10-CM

## 2016-11-02 DIAGNOSIS — R51 Headache: Secondary | ICD-10-CM

## 2016-11-02 DIAGNOSIS — Z130 Encounter for screening for diseases of the blood and blood-forming organs and certain disorders involving the immune mechanism: Secondary | ICD-10-CM | POA: Diagnosis not present

## 2016-11-02 DIAGNOSIS — Z13220 Encounter for screening for lipoid disorders: Secondary | ICD-10-CM

## 2016-11-02 DIAGNOSIS — R519 Headache, unspecified: Secondary | ICD-10-CM

## 2016-11-02 DIAGNOSIS — Z113 Encounter for screening for infections with a predominantly sexual mode of transmission: Secondary | ICD-10-CM | POA: Diagnosis not present

## 2016-11-02 MED ORDER — CEPHALEXIN 500 MG PO CAPS: 500.0000 mg | ORAL_CAPSULE | Freq: Two times a day (BID) | ORAL | 0 refills | Status: AC

## 2016-11-02 MED ORDER — IBUPROFEN 800 MG PO TABS: 800.0000 mg | ORAL_TABLET | Freq: Three times a day (TID) | ORAL | 0 refills | Status: DC | PRN

## 2016-11-02 NOTE — Progress Notes (Signed)
[facility] [address], [place] [address] [phone]  Date:  11/02/2016   Name:  Victoria Mercado   DOB:  1991-01-18   MRN:  [id number]  PCP:  Elizabeth Sauer    History of Present Illness:  Victoria Mercado is a 26 y.o. female patient who presents to [facility]  For annual physical exam. Diet: using my fitness pal staying under 1700.  She does not eat pork or beef.  Water intake is about 64 oz.  She has no sodas or juices or teas.    BM: NORMAL.  No blood or black stool.  No constiatpijnonb  Or diarrhea.  Urination: normal.  No dysuria,hematuria, or frequency.  Sleep is 6 hours.  Hard to stay asleep.    P[facility]ned [facility]/ [facility].  Normal findings.  Grad school, public health phd.  Going well.    Sexually active last in September.     Wt Readings from Last 3 Encounters:  11/02/16 212 lb 12.8 oz (96.5 kg)  10/13/15 205 lb 12.8 oz (93.4 kg)  07/18/12 215 lb (97.5 kg)     Patient Active Problem List   Diagnosis Date Noted  . Obesity 05/17/2012    Past Medical History:  Diagnosis Date  . Allergy   . GERD (gastroesophageal reflux disease)     History reviewed. No pertinent surgical history.  Social History  Substance Use Topics  . Smoking status: Never Smoker  . Smokeless tobacco: Never Used  . Alcohol use Yes    Family History  Problem Relation Age of Onset  . Cancer Maternal Grandmother 75    ovarian cancer  . Hypertension Maternal Grandfather   . Stroke Maternal Grandfather   . Hypertension Paternal Grandmother     No Known Allergies  Medication list has been reviewed and updated.  Current Outpatient Prescriptions on File Prior to Visit  Medication Sig Dispense Refill  . ibuprofen (ADVIL,MOTRIN) 800 MG tablet TAKE 1 TABLET(800 MG) BY MOUTH EVERY 8 HOURS AS NEEDED 30 tablet 0  . Levonorgestrel (SKYLA) 13.5 MG IUD 1 Device by Intrauterine route once. Reported on 10/13/2015    . Acetaminophen-Caffeine (EXCEDRIN  TENSION HEADACHE) 500-65 MG TABS Take 2 tablets by mouth once as needed. Reported on 10/13/2015    . metaxalone (SKELAXIN) 800 MG tablet Take 1/2 tablet  2-3 times daily for headache. (Patient not taking: Reported on 11/02/2016) 40 tablet 1  . Prenatal Vit-Fe Fumarate-FA (MULTIVITAMIN-PRENATAL) 27-0.8 MG TABS Take 1 tablet by mouth daily. Reported on 10/13/2015    . ranitidine (ZANTAC) 150 MG tablet Take 150 mg by mouth daily. Reported on 10/13/2015    . SUMAtriptan-naproxen (TREXIMET) 85-500 MG tablet Take 1 tablet by mouth every 2 (two) hours as needed for migraine. No more than 2 tablets in 24 hours. (Patient not taking: Reported on 11/02/2016) 10 tablet 0   No current facility-administered medications on file prior to visit.     Review of Systems  Constitutional: Negative for chills and fever.  HENT: Negative for ear discharge, ear pain and sore throat.   Eyes: Negative for blurred vision and double vision.  Respiratory: Negative for cough, shortness of breath and wheezing.   Cardiovascular: Negative for chest pain, palpitations and leg swelling.  Gastrointestinal: Negative for diarrhea, nausea and vomiting.  Genitourinary: Negative for dysuria, frequency and hematuria.  Skin: Negative for itching and rash.  Neurological: Negative for dizziness and headaches.     Physical Examination: BP 119/80 (BP Location:  Right Arm, Patient Position: Sitting, Cuff Size: Large)   Pulse 91   Temp 97.9 F (36.6 C) (Oral)   Ht '5\' 3"'  (1.6 m)   Wt 212 lb 12.8 oz (96.5 kg)   LMP 10/24/2016   SpO2 100%   BMI 37.70 kg/m  Ideal Body Weight: Weight in (lb) to have BMI = 25: 140.8  Physical Exam  Constitutional: She is oriented to person, place, and time. She appears well-developed and well-nourished. No distress.  HENT:  Head: Normocephalic and atraumatic.  Right Ear: Tympanic membrane, external ear and ear canal normal.  Left Ear: Tympanic membrane, external ear and ear canal normal.  Nose: Right  sinus exhibits no maxillary sinus tenderness and no frontal sinus tenderness. Left sinus exhibits no maxillary sinus tenderness and no frontal sinus tenderness.  Mouth/Throat: Oropharynx is clear and moist. No uvula swelling. No oropharyngeal exudate, posterior oropharyngeal edema or posterior oropharyngeal erythema.  Eyes: Conjunctivae and EOM are normal. Pupils are equal, round, and reactive to light.  Neck: Normal range of motion. Neck supple. No thyromegaly present.  Cardiovascular: Normal rate, regular rhythm, normal heart sounds and intact distal pulses.  Exam reveals no gallop, no distant heart sounds and no friction rub.   No murmur heard. Pulmonary/Chest: Effort normal and breath sounds normal. No respiratory distress. She has no decreased breath sounds. She has no wheezes. She has no rhonchi.  Abdominal: Soft. Bowel sounds are normal. She exhibits no distension and no mass. There is no tenderness.  Musculoskeletal: Normal range of motion. She exhibits no edema or tenderness.  Lymphadenopathy:       Head (right side): No submandibular, no tonsillar, no preauricular and no posterior auricular adenopathy present.       Head (left side): No submandibular, no tonsillar, no preauricular and no posterior auricular adenopathy present.    She has no cervical adenopathy.  Neurological: She is alert and oriented to person, place, and time. No cranial nerve deficit. She exhibits normal muscle tone. Coordination normal.  Skin: Skin is warm and dry. She is not diaphoretic.  Psychiatric: She has a normal mood and affect. Her behavior is normal.     Assessment and Plan: Victoria Mercado is a 26 y.o. female who is here today for annual physical exam. Very rarely takes ibuprofen for head pain, and I will refill it at this time. General physical labs performed today.   Annual physical exam - Plan: CMP14+EGFR, CBC, TSH, RPR, HIV antibody, Lipid panel  Screening for deficiency anemia - Plan:  CBC  Screening for thyroid disorder - Plan: TSH  Screening for STD (sexually transmitted disease) - Plan: RPR, HIV antibody  Screening for metabolic disorder - Plan: CMP14+EGFR  Screening for lipid disorders - Plan: Lipid panel  Frontal headache - Plan: ibuprofen (ADVIL,MOTRIN) 800 MG tablet  Ivar Drape, PA-C [facility] 2/28/201810:49 PM

## 2016-11-02 NOTE — Patient Instructions (Addendum)
Continue to use warm compresses in the vaginal area.  Heating a sock full of rice and hold the heat as well.  Take antibiotic as prescribed.  Let me know if there is no improvement within the next 3 days.     IF you received an x-ray today, you will receive an invoice from [facility]. Please contact [facility] at [phone] with questions or concerns regarding your invoice.   IF you received labwork today, you will receive an invoice from [place]. Please contact [facility] at [phone] with questions or concerns regarding your invoice.   Our billing staff will not be able to assist you with questions regarding bills from these companies.  You will be contacted with the lab results as soon as they are available. The fastest way to get your results is to activate your My Chart account. Instructions are located on the last page of this paperwork. If you have not heard from [place] regarding the results in 2 weeks, please contact this office.     Keeping You Healthy  Get These Tests 1. Blood Pressure- Have your blood pressure checked once a year by your health care provider.  Normal blood pressure is 120/80. 2. Weight- Have your body mass index (BMI) calculated to screen for obesity.  BMI is measure of body fat based on height and weight.  You can also calculate your own BMI at https://www.west-esparza.com/. 3. Cholesterol- Have your cholesterol checked every 5 years starting at age 104 then yearly starting at age 44. 4. Chlamydia, HIV, and other sexually transmitted diseases- Get screened every year until age 30, then within three months of each new sexual provider. 5. Pap Test - Every 1-5 years; discuss with your health care provider. 6. Mammogram- Every 1-2 years starting at age 12--50  Take these medicines  Calcium with Vitamin D-Your body needs 1200 mg of Calcium each day and [phone] IU of Vitamin D daily.  Your body can only absorb 500 mg of Calcium at a time so Calcium  must be taken in 2 or 3 divided doses throughout the day.  Multivitamin with folic acid- Once daily if it is possible for you to become pregnant.  Get these Immunizations  Gardasil-Series of three doses; prevents HPV related illness such as genital warts and cervical cancer.  Menactra-Single dose; prevents meningitis.  Tetanus shot- Every 10 years.  Flu shot-Every year.  Take these steps 1. Do not smoke-Your healthcare provider can help you quit.  For tips on how to quit go to www.smokefree.gov or call 1-800 QUITNOW. 2. Be physically active- Exercise 5 days a week for at least 30 minutes.  If you are not already physically active, start slow and gradually work up to 30 minutes of moderate physical activity.  Examples of moderate activity include walking briskly, dancing, swimming, bicycling, etc. 3. Breast Cancer- A self breast exam every month is important for early detection of breast cancer.  For more information and instruction on self breast exams, ask your healthcare provider or [place].es. 4. Eat a healthy diet- Eat a variety of healthy foods such as fruits, vegetables, whole grains, low fat milk, low fat cheeses, yogurt, lean meats, poultry and fish, beans, nuts, tofu, etc.  For more information go to www. Thenutritionsource.org 5. Drink alcohol in moderation- Limit alcohol intake to one drink or less per day. Never drink and drive. 6. Depression- Your emotional health is as important as your physical health.  If you're feeling down or losing interest in things you  normally enjoy please talk to your healthcare provider about being screened for depression. 7. Dental visit- Brush and floss your teeth twice daily; visit your dentist twice a year. 8. Eye doctor- Get an eye exam at least every 2 years. 9. Helmet use- Always wear a helmet when riding a bicycle, motorcycle, rollerblading or skateboarding. 10. Safe sex- If you may be exposed to sexually  transmitted infections, use a condom. 11. Seat belts- Seat belts can save your live; always wear one. 12. Smoke/Carbon Monoxide detectors- These detectors need to be installed on the appropriate level of your home. Replace batteries at least once a year. 13. Skin cancer- When out in the sun please cover up and use sunscreen 15 SPF or higher. 14. Violence- If anyone is threatening or hurting you, please tell your healthcare provider.

## 2016-11-03 LAB — CMP14+EGFR
ALBUMIN: 4.1 g/dL (ref 3.5–5.5)
ALK PHOS: 50 IU/L (ref 39–117)
ALT: 13 IU/L (ref 0–32)
AST: 14 IU/L (ref 0–40)
Albumin/Globulin Ratio: 1.6 (ref 1.2–2.2)
BUN/Creatinine Ratio: 11 (ref 9–23)
BUN: 8 mg/dL (ref 6–20)
Bilirubin Total: 0.3 mg/dL (ref 0.0–1.2)
CALCIUM: 8.8 mg/dL (ref 8.7–10.2)
CO2: 22 mmol/L (ref 18–29)
CREATININE: 0.73 mg/dL (ref 0.57–1.00)
Chloride: 102 mmol/L (ref 96–106)
GFR, EST AFRICAN AMERICAN: 131 (ref >59)
GFR, EST NON AFRICAN AMERICAN: 113 (ref >59)
GLUCOSE: 83 mg/dL (ref 65–99)
Globulin, Total: 2.6 (ref 1.5–4.5)
Potassium: 4 mmol/L (ref 3.5–5.2)
SODIUM: 140 mmol/L (ref 134–144)
TOTAL PROTEIN: 6.7 g/dL (ref 6.0–8.5)

## 2016-11-03 LAB — CBC
HEMATOCRIT: 34.9 % (ref 34.0–46.6)
Hemoglobin: 11.5 g/dL (ref 11.1–15.9)
MCH: 29.2 pg (ref 26.6–33.0)
MCHC: 33 g/dL (ref 31.5–35.7)
MCV: 89 fL (ref 79–97)
Platelets: 234 10*3/uL (ref 150–379)
RBC: 3.94 x10E6/uL (ref 3.77–5.28)
RDW: 13.8 % (ref 12.3–15.4)
WBC: 3.8 10*3/uL (ref 3.4–10.8)

## 2016-11-03 LAB — RPR: RPR Ser Ql: NONREACTIVE

## 2016-11-03 LAB — HIV ANTIBODY (ROUTINE TESTING W REFLEX): HIV SCREEN 4TH GENERATION: NONREACTIVE

## 2016-11-03 LAB — LIPID PANEL
Chol/HDL Ratio: 3.5 (ref 0.0–4.4)
Cholesterol, Total: 181 mg/dL (ref 100–199)
HDL: 51 mg/dL (ref >39)
LDL CALC: 115 — AB (ref 0–99)
Triglycerides: 76 mg/dL (ref 0–149)
VLDL CHOLESTEROL CAL: 15 (ref 5–40)

## 2016-11-03 LAB — TSH: TSH: 1.28 u[IU]/mL (ref 0.450–4.500)

## 2017-02-17 ENCOUNTER — Other Ambulatory Visit: Payer: Self-pay | Admitting: Physician Assistant

## 2017-02-17 DIAGNOSIS — R51 Headache: Principal | ICD-10-CM

## 2017-02-17 DIAGNOSIS — R519 Headache, unspecified: Secondary | ICD-10-CM

## 2017-02-17 MED ORDER — IBUPROFEN 800 MG PO TABS: 800.0000 mg | ORAL_TABLET | Freq: Three times a day (TID) | ORAL | 0 refills | Status: AC | PRN

## 2017-02-17 NOTE — Telephone Encounter (Signed)
PATIENT WOULD LIKE Victoria Mercado TO KNOW THAT SHE NEEDS A REFILL ON HER IBUPROFEN 800 MG. SHE WOULD LIKE TO GET MORE THAN 30 PILLS IF POSSIBLE. BEST PHONE [phone] (CELL) PHARMACY CHOICE IS [facility] ON [address]. MBC

## 2017-02-17 NOTE — Telephone Encounter (Signed)
11/02/16 last ov and refill

## 2017-02-17 NOTE — Telephone Encounter (Signed)
Please advise patient: She really should not take every day.  It can cause cardiovascular risk and GI problems.  If she is needing this more than every day, then we should look at other options.  Refilling for 90

## 2017-07-28 ENCOUNTER — Other Ambulatory Visit: Payer: Self-pay

## 2017-07-28 ENCOUNTER — Ambulatory Visit: Payer: BLUE CROSS/BLUE SHIELD | Admitting: Physician Assistant

## 2017-07-28 ENCOUNTER — Ambulatory Visit: Payer: Self-pay | Admitting: Physician Assistant

## 2017-07-28 VITALS — BP 118/76 | HR 96 | Temp 98.2°F | Resp 16 | Ht 63.0 in | Wt 217.0 lb

## 2017-07-28 DIAGNOSIS — M533 Sacrococcygeal disorders, not elsewhere classified: Secondary | ICD-10-CM

## 2017-07-28 MED ORDER — CYCLOBENZAPRINE HCL 10 MG PO TABS: 10.0000 mg | ORAL_TABLET | Freq: Three times a day (TID) | ORAL | 0 refills | Status: AC | PRN

## 2017-07-28 MED ORDER — MELOXICAM 15 MG PO TABS: 15.0000 mg | ORAL_TABLET | Freq: Every day | ORAL | 0 refills | Status: AC

## 2017-07-28 NOTE — Patient Instructions (Addendum)
Please note that the flexeril can cause some sedation.  Do not take the muscle relaxant above, while driving or operating heavy machinery. The mobic is like an nsaid--so do not take the ibuprofen, or naproxen, while taking this medication.  This will help with the inflammation Please ice the area three times per day for 15 minutes. Please use a donut, or padding under the buttock when sitting. And please stand every 30 minutes and walk around for 5 minutes, or get the pressure off the area.  If you have been doing this, then shorten the interval of sitting to standing.       IF you received an x-ray today, you will receive an invoice from [facility]. Please contact [facility] at [phone] with questions or concerns regarding your invoice.   IF you received labwork today, you will receive an invoice from [facility]. Please contact [facility] at [phone] with questions or concerns regarding your invoice.   Our billing staff will not be able to assist you with questions regarding bills from these companies.  You will be contacted with the lab results as soon as they are available. The fastest way to get your results is to activate your My Chart account. Instructions are located on the last page of this paperwork. If you have not heard from us regarding the results in 2 weeks, please contact this office.

## 2017-07-28 NOTE — Progress Notes (Signed)
[facility] [address], [place] [address] [phone]  Date:  07/28/2017   Name:  Victoria Mercado Bonnes   DOB:  07/25/1991   MRN:  [id number]  PCP:  Garnetta BuddyEnglish, Stephanie D, PA    History of Present Illness:  Victoria Mercado Chimenti is a 26 y.o. female patient who presents to PCP with  Chief Complaint  Patient presents with  . Back Pain    x 2 wks     Tail bone pain for the last 2 weeks.  The pain has worsened.  She has not noted any swelling, but feels like her muscles are tight there.  At times the pain will wake her up at night.  She has not had any heavy lifting.  No dysuria, hematuria, or frequency.  No fever.   She tried a heating pad.   She is sitting and typing 15 hours per day.    Patient Active Problem List   Diagnosis Date Noted  . Obesity 05/17/2012    Past Medical History:  Diagnosis Date  . Allergy   . GERD (gastroesophageal reflux disease)     No past surgical history on file.  Social History   Tobacco Use  . Smoking status: Never Smoker  . Smokeless tobacco: Never Used  Substance Use Topics  . Alcohol use: Yes  . Drug use: No    Family History  Problem Relation Age of Onset  . Cancer Maternal Grandmother [id number]       ovarian cancer  . Hypertension Maternal Grandfather   . Stroke Maternal Grandfather   . Hypertension Paternal Grandmother     No Known Allergies  Medication list has been reviewed and updated.  Current Outpatient Medications on File Prior to Visit  Medication Sig Dispense Refill  . ibuprofen (ADVIL,MOTRIN) 800 MG tablet Take 1 tablet (800 mg total) by mouth every 8 (eight) hours as needed. 90 tablet 0  . Levonorgestrel (SKYLA) 13.5 MG IUD 1 Device by Intrauterine route once. Reported on 10/13/2015    . Acetaminophen-Caffeine (EXCEDRIN TENSION HEADACHE) 500-65 MG TABS Take 2 tablets by mouth once as needed. Reported on 10/13/2015    . metaxalone (SKELAXIN) 800 MG tablet Take 1/2 tablet  2-3 times daily for headache. (Patient not  taking: Reported on 11/02/2016) 40 tablet 1  . Prenatal Vit-Fe Fumarate-FA (MULTIVITAMIN-PRENATAL) 27-0.8 MG TABS Take 1 tablet by mouth daily. Reported on 10/13/2015    . ranitidine (ZANTAC) 150 MG tablet Take 150 mg by mouth daily. Reported on 10/13/2015    . SUMAtriptan-naproxen (TREXIMET) 85-500 MG tablet Take 1 tablet by mouth every 2 (two) hours as needed for migraine. No more than 2 tablets in 24 hours. (Patient not taking: Reported on 11/02/2016) 10 tablet 0   No current facility-administered medications on file prior to visit.     ROS ROS otherwise unremarkable unless listed above.  Physical Examination: BP 118/76   Pulse 96   Temp 98.2 F (36.8 C) (Oral)   Resp 16   Ht 5\' 3"  (1.6 m)   Wt 217 lb (98.4 kg)   LMP 07/15/2017   SpO2 97%   BMI 38.44 kg/m  Ideal Body Weight: Weight in (lb) to have BMI = 25: 140.8  Physical Exam  Constitutional: She is oriented to person, place, and time. She appears well-developed and well-nourished. No distress.  HENT:  Head: Normocephalic and atraumatic.  Right Ear: External ear normal.  Left Ear: External ear normal.  Eyes: Conjunctivae and EOM are normal. Pupils are  equal, round, and reactive to light.  Cardiovascular: Normal rate.  Pulmonary/Chest: Effort normal. No respiratory distress.  Musculoskeletal:       Lumbar back: She exhibits bony tenderness (along the proximal sacrum and neighboring musculature <right). She exhibits normal range of motion.  Normal rom  Neurological: She is alert and oriented to person, place, and time.  Skin: Skin is warm, dry and intact. She is not diaphoretic.  Gluteal cleft and neighboring skin without erythema or inflammatory changes.   Psychiatric: She has a normal mood and affect. Her behavior is normal.     Assessment and Plan: Victoria Mercado [place] is a 26 y.o. female who is here today for cc of  Chief Complaint  Patient presents with  . Back Pain    x 2 wks   This appears to be due to prolonged  sitting.  Could be musculature more likely with strain/pressure inflammation.  Differential includes sacroiliitis, vs pilonidal disease. Advised icing, taking pressure off with periodic standing, and additional cushioning.  Anti-inflammatory given with precautions advised.   If pain worsens, will perform imaging.  Sacral back pain - Plan: cyclobenzaprine (FLEXERIL) 10 MG tablet, meloxicam (MOBIC) 15 MG tablet  Trena PlattStephanie English, PA-C [facility] 11/16/20189:14 AM

## 2017-07-28 NOTE — Progress Notes (Deleted)
[facility] [address], [place] [address] [phone]  Date:  07/28/2017   Name:  Victoria Mercado   DOB:  03/28/1991   MRN:  [id number]  PCP:  Garnetta BuddyEnglish, Jasmeet Manton D, PA    History of Present Illness:  Victoria Mercado is a 26 y.o. female patient who presents to PCP with  Chief Complaint  Patient presents with  . Back Pain    x 2 wks       Patient Active Problem List   Diagnosis Date Noted  . Obesity 05/17/2012    Past Medical History:  Diagnosis Date  . Allergy   . GERD (gastroesophageal reflux disease)     No past surgical history on file.  Social History   Tobacco Use  . Smoking status: Never Smoker  . Smokeless tobacco: Never Used  Substance Use Topics  . Alcohol use: Yes  . Drug use: No    Family History  Problem Relation Age of Onset  . Cancer Maternal Grandmother 1070       ovarian cancer  . Hypertension Maternal Grandfather   . Stroke Maternal Grandfather   . Hypertension Paternal Grandmother     No Known Allergies  Medication list has been reviewed and updated.  Current Outpatient Medications on File Prior to Visit  Medication Sig Dispense Refill  . ibuprofen (ADVIL,MOTRIN) 800 MG tablet Take 1 tablet (800 mg total) by mouth every 8 (eight) hours as needed. 90 tablet 0  . Levonorgestrel (SKYLA) 13.5 MG IUD 1 Device by Intrauterine route once. Reported on 10/13/2015    . Acetaminophen-Caffeine (EXCEDRIN TENSION HEADACHE) 500-65 MG TABS Take 2 tablets by mouth once as needed. Reported on 10/13/2015    . metaxalone (SKELAXIN) 800 MG tablet Take 1/2 tablet  2-3 times daily for headache. (Patient not taking: Reported on 11/02/2016) 40 tablet 1  . Prenatal Vit-Fe Fumarate-FA (MULTIVITAMIN-PRENATAL) 27-0.8 MG TABS Take 1 tablet by mouth daily. Reported on 10/13/2015    . ranitidine (ZANTAC) 150 MG tablet Take 150 mg by mouth daily. Reported on 10/13/2015    . SUMAtriptan-naproxen (TREXIMET) 85-500 MG tablet Take 1 tablet by mouth every 2 (two)  hours as needed for migraine. No more than 2 tablets in 24 hours. (Patient not taking: Reported on 11/02/2016) 10 tablet 0   No current facility-administered medications on file prior to visit.     ROS ROS otherwise unremarkable unless listed above.  Physical Examination: BP 118/76   Pulse 96   Temp 98.2 F (36.8 C) (Oral)   Resp 16   Ht 5\' 3"  (1.6 m)   Wt 217 lb (98.4 kg)   LMP 07/15/2017   SpO2 97%   BMI 38.44 kg/m  Ideal Body Weight: Weight in (lb) to have BMI = 25: 140.8  Physical Exam   Assessment and Plan: Victoria Mercado is a 26 y.o. female who is here today  There are no diagnoses linked to this encounter.  Trena PlattStephanie Arleene Settle, PA-C [facility] 07/28/2017 11:27 AM

## 2017-07-29 ENCOUNTER — Encounter: Payer: Self-pay | Admitting: Physician Assistant

## 2017-08-08 ENCOUNTER — Ambulatory Visit: Payer: BLUE CROSS/BLUE SHIELD | Admitting: Physician Assistant

## 2017-10-25 DIAGNOSIS — Z30432 Encounter for removal of intrauterine contraceptive device: Secondary | ICD-10-CM | POA: Diagnosis not present

## 2017-12-14 ENCOUNTER — Encounter: Payer: Self-pay | Admitting: Physician Assistant

## 2018-01-24 DIAGNOSIS — L918 Other hypertrophic disorders of the skin: Secondary | ICD-10-CM | POA: Diagnosis not present

## 2018-01-24 DIAGNOSIS — Z01419 Encounter for gynecological examination (general) (routine) without abnormal findings: Secondary | ICD-10-CM | POA: Diagnosis not present

## 2018-01-24 DIAGNOSIS — Z6838 Body mass index (BMI) 38.0-38.9, adult: Secondary | ICD-10-CM | POA: Diagnosis not present

## 2018-03-01 DIAGNOSIS — R87612 Low grade squamous intraepithelial lesion on cytologic smear of cervix (LGSIL): Secondary | ICD-10-CM | POA: Diagnosis not present

## 2018-03-01 DIAGNOSIS — N870 Mild cervical dysplasia: Secondary | ICD-10-CM | POA: Diagnosis not present

## 2018-04-10 DIAGNOSIS — R109 Unspecified abdominal pain: Secondary | ICD-10-CM | POA: Diagnosis not present

## 2018-04-13 DIAGNOSIS — R1032 Left lower quadrant pain: Secondary | ICD-10-CM | POA: Diagnosis not present

## 2018-04-17 DIAGNOSIS — M6281 Muscle weakness (generalized): Secondary | ICD-10-CM | POA: Diagnosis not present

## 2018-04-17 DIAGNOSIS — M62838 Other muscle spasm: Secondary | ICD-10-CM | POA: Diagnosis not present

## 2018-04-17 DIAGNOSIS — M533 Sacrococcygeal disorders, not elsewhere classified: Secondary | ICD-10-CM | POA: Diagnosis not present

## 2018-04-24 DIAGNOSIS — M6281 Muscle weakness (generalized): Secondary | ICD-10-CM | POA: Diagnosis not present

## 2018-04-24 DIAGNOSIS — M62838 Other muscle spasm: Secondary | ICD-10-CM | POA: Diagnosis not present

## 2018-04-24 DIAGNOSIS — M533 Sacrococcygeal disorders, not elsewhere classified: Secondary | ICD-10-CM | POA: Diagnosis not present

## 2018-12-06 ENCOUNTER — Encounter: Payer: BLUE CROSS/BLUE SHIELD | Admitting: Family Medicine
